# Patient Record
Sex: Male | Born: 1965 | Race: White | Hispanic: No | Marital: Married | Smoking: Current every day smoker
Health system: Southern US, Community
[De-identification: ages and names within clinical notes are randomized; demographics above are authoritative.]

## PROBLEM LIST (undated history)

## (undated) DIAGNOSIS — R0789 Other chest pain: Secondary | ICD-10-CM

## (undated) DIAGNOSIS — R9430 Abnormal result of cardiovascular function study, unspecified: Secondary | ICD-10-CM

## (undated) DIAGNOSIS — I10 Essential (primary) hypertension: Secondary | ICD-10-CM

## (undated) DIAGNOSIS — J189 Pneumonia, unspecified organism: Secondary | ICD-10-CM

## (undated) DIAGNOSIS — R002 Palpitations: Secondary | ICD-10-CM

## (undated) HISTORY — PX: COLON SURGERY: SHX602

## (undated) HISTORY — DX: Abnormal result of cardiovascular function study, unspecified: R94.30

## (undated) HISTORY — DX: Other chest pain: R07.89

## (undated) HISTORY — DX: Palpitations: R00.2

## (undated) HISTORY — PX: HERNIA REPAIR: SHX51

---

## 2015-01-07 ENCOUNTER — Observation Stay (HOSPITAL_COMMUNITY)
Admission: EM | Admit: 2015-01-07 | Discharge: 2015-01-08 | Disposition: A | Discharge status: Home / Self Care | Payer: Medicaid Other | Attending: Internal Medicine | Admitting: Internal Medicine

## 2015-01-07 ENCOUNTER — Emergency Department (HOSPITAL_COMMUNITY): Payer: Medicaid Other

## 2015-01-07 ENCOUNTER — Other Ambulatory Visit (HOSPITAL_COMMUNITY): Payer: Self-pay

## 2015-01-07 ENCOUNTER — Encounter (HOSPITAL_COMMUNITY): Payer: Self-pay | Admitting: *Deleted

## 2015-01-07 DIAGNOSIS — R61 Generalized hyperhidrosis: Secondary | ICD-10-CM | POA: Diagnosis not present

## 2015-01-07 DIAGNOSIS — Z720 Tobacco use: Secondary | ICD-10-CM | POA: Diagnosis not present

## 2015-01-07 DIAGNOSIS — F17210 Nicotine dependence, cigarettes, uncomplicated: Secondary | ICD-10-CM | POA: Insufficient documentation

## 2015-01-07 DIAGNOSIS — I10 Essential (primary) hypertension: Secondary | ICD-10-CM | POA: Diagnosis not present

## 2015-01-07 DIAGNOSIS — R079 Chest pain, unspecified: Secondary | ICD-10-CM | POA: Diagnosis present

## 2015-01-07 DIAGNOSIS — R0789 Other chest pain: Secondary | ICD-10-CM | POA: Diagnosis not present

## 2015-01-07 DIAGNOSIS — K219 Gastro-esophageal reflux disease without esophagitis: Secondary | ICD-10-CM | POA: Insufficient documentation

## 2015-01-07 DIAGNOSIS — R0602 Shortness of breath: Secondary | ICD-10-CM | POA: Insufficient documentation

## 2015-01-07 DIAGNOSIS — E785 Hyperlipidemia, unspecified: Secondary | ICD-10-CM | POA: Insufficient documentation

## 2015-01-07 DIAGNOSIS — R059 Cough, unspecified: Secondary | ICD-10-CM | POA: Diagnosis present

## 2015-01-07 DIAGNOSIS — R05 Cough: Secondary | ICD-10-CM | POA: Diagnosis present

## 2015-01-07 HISTORY — DX: Pneumonia, unspecified organism: J18.9

## 2015-01-07 LAB — CBC WITH DIFFERENTIAL/PLATELET
BASOS ABS: 0 10*3/uL (ref 0.0–0.1)
BASOS PCT: 0 % (ref 0–1)
EOS PCT: 2 % (ref 0–5)
Eosinophils Absolute: 0.2 10*3/uL (ref 0.0–0.7)
HCT: 48.7 % (ref 39.0–52.0)
Hemoglobin: 17.5 g/dL — ABNORMAL HIGH (ref 13.0–17.0)
Lymphocytes Relative: 31 % (ref 12–46)
Lymphs Abs: 3.2 10*3/uL (ref 0.7–4.0)
MCH: 30.9 pg (ref 26.0–34.0)
MCHC: 35.9 g/dL (ref 30.0–36.0)
MCV: 85.9 fL (ref 78.0–100.0)
MONO ABS: 0.8 10*3/uL (ref 0.1–1.0)
Monocytes Relative: 8 % (ref 3–12)
Neutro Abs: 6.1 10*3/uL (ref 1.7–7.7)
Neutrophils Relative %: 59 % (ref 43–77)
PLATELETS: 232 10*3/uL (ref 150–400)
RBC: 5.67 MIL/uL (ref 4.22–5.81)
RDW: 13.6 % (ref 11.5–15.5)
WBC: 10.4 10*3/uL (ref 4.0–10.5)

## 2015-01-07 LAB — BASIC METABOLIC PANEL
Anion gap: 9 (ref 5–15)
BUN: 13 mg/dL (ref 6–23)
CO2: 25 mmol/L (ref 19–32)
Calcium: 8.8 mg/dL (ref 8.4–10.5)
Chloride: 104 mmol/L (ref 96–112)
Creatinine, Ser: 0.79 mg/dL (ref 0.50–1.35)
GFR calc Af Amer: >90 mL/min (ref >90)
GFR calc non Af Amer: >90 mL/min (ref >90)
GLUCOSE: 113 mg/dL — AB (ref 70–99)
Potassium: 3.8 mmol/L (ref 3.5–5.1)
Sodium: 138 mmol/L (ref 135–145)

## 2015-01-07 LAB — I-STAT TROPONIN, ED: TROPONIN I, POC: 0.01 ng/mL (ref 0.00–0.08)

## 2015-01-07 LAB — TROPONIN I: Troponin I: <0.03 ng/mL (ref <0.031)

## 2015-01-07 MED ORDER — NITROGLYCERIN 0.4 MG SL SUBL
0.4000 mg | SUBLINGUAL_TABLET | SUBLINGUAL | Status: DC | PRN
  Administered 2015-01-07 (×2): 0.4 mg via SUBLINGUAL
  Filled 2015-01-07 (×2): qty 1

## 2015-01-07 MED ORDER — ENOXAPARIN SODIUM 40 MG/0.4ML ~~LOC~~ SOLN
40.0000 mg | SUBCUTANEOUS | Status: DC
Start: 2015-01-07 — End: 2015-01-08
  Administered 2015-01-07: 40 mg via SUBCUTANEOUS
  Filled 2015-01-07: qty 0.4

## 2015-01-07 MED ORDER — NITROGLYCERIN 2 % TD OINT
1.0000 [in_us] | TOPICAL_OINTMENT | Freq: Once | TRANSDERMAL | Status: AC
Start: 2015-01-07 — End: 2015-01-07
  Administered 2015-01-07: 1 [in_us] via TOPICAL
  Filled 2015-01-07: qty 30

## 2015-01-07 MED ORDER — GI COCKTAIL ~~LOC~~: 30.0000 mL | Freq: Four times a day (QID) | ORAL | Status: DC | PRN

## 2015-01-07 MED ORDER — GUAIFENESIN-DM 100-10 MG/5ML PO SYRP: 5.0000 mL | ORAL_SOLUTION | ORAL | Status: DC | PRN

## 2015-01-07 MED ORDER — NITROGLYCERIN 2 % TD OINT: 1.0000 [in_us] | TOPICAL_OINTMENT | Freq: Three times a day (TID) | TRANSDERMAL | Status: DC | PRN

## 2015-01-07 MED ORDER — ASPIRIN EC 325 MG PO TBEC
325.0000 mg | DELAYED_RELEASE_TABLET | Freq: Every day | ORAL | Status: DC
Start: 2015-01-08 — End: 2015-01-08
  Administered 2015-01-08: 325 mg via ORAL
  Filled 2015-01-07: qty 1

## 2015-01-07 MED ORDER — ACETAMINOPHEN 325 MG PO TABS: 650.0000 mg | ORAL_TABLET | ORAL | Status: DC | PRN

## 2015-01-07 MED ORDER — METOPROLOL TARTRATE 25 MG PO TABS
25.0000 mg | ORAL_TABLET | Freq: Two times a day (BID) | ORAL | Status: DC
  Administered 2015-01-07 – 2015-01-08 (×3): 25 mg via ORAL
  Filled 2015-01-07 (×3): qty 1

## 2015-01-07 MED ORDER — ASPIRIN 81 MG PO CHEW
162.0000 mg | CHEWABLE_TABLET | Freq: Once | ORAL | Status: AC
  Administered 2015-01-07: 162 mg via ORAL
  Filled 2015-01-07: qty 2

## 2015-01-07 MED ORDER — ONDANSETRON HCL 4 MG/2ML IJ SOLN: 4.0000 mg | Freq: Four times a day (QID) | INTRAMUSCULAR | Status: DC | PRN

## 2015-01-07 MED ORDER — ASPIRIN 325 MG PO TABS: 325.0000 mg | ORAL_TABLET | Freq: Once | ORAL | Status: DC

## 2015-01-07 NOTE — Consult Note (Addendum)
CARDIOLOGY CONSULT NOTE   Patient ID: Daryl Fuller MRN: [id number] DOB/AGE: February 03, 1966 49 y.o.  Admit Date: 01/07/2015  Primary Physician: No primary care provider on file.  Primary Cardiologist    Narda Rutherford   Clinical Summary Daryl Fuller is a 49 y.o.male. There is no prior documented cardiac history. Daryl Fuller presented since with greater than 7 days of vague chest discomfort. Daryl Fuller has had a cough which has been productive. Daryl Fuller also says that Daryl Fuller does have some chest discomfort with deep inspiration. Chest x-ray does not show any significant abnormalities. There is no fever. Daryl Fuller says that Daryl Fuller felt better in the emergency room after his blood was drawn at the same time that Daryl Fuller had nitroglycerin. EKG shows nonspecific ST-T wave changes. His first enzyme is normal. The patient does smoke.   No Known Allergies  Medications Scheduled Medications:     Infusions:     PRN Medications:  nitroGLYCERIN   Past Medical History  Diagnosis Date  . Pneumonia     Past Surgical History  Procedure Laterality Date  . Colon surgery    . Hernia repair      Family history: There is a family history of coronary disease. Patient's father had an MI at age 44.  Social History Daryl Fuller reports that Daryl Fuller has been smoking.  Daryl Fuller has never used smokeless tobacco. Daryl Fuller reports that Daryl Fuller does not drink alcohol.  Review of Systems Complete review of systems are found to be negative unless outlined in H&P above.  Physical Examination Blood pressure 131/91, pulse 95, temperature 98.7 F (37.1 C), temperature source Oral, resp. rate 16, SpO2 98 %. No intake or output data in the 24 hours ending 01/07/15 1240  The patient is with his family in the emergency room. Daryl Fuller is oriented to person time and place. Affect is normal. His English is good. Head is atraumatic. Sclera and conjunctiva are normal. There is no jugular venous distention. Lungs are clear. Respiratory effort is not labored. Cardiac exam  reveals S1 and S2. The abdomen is soft. There is no peripheral edema. There are no musculoskeletal deformities. There are no skin rashes. Neurologic is grossly intact.  Prior Cardiac Testing/Procedures  Lab Results  Basic Metabolic Panel:  Recent Labs Lab 01/07/15 0845  NA 138  K 3.8  CL 104  CO2 25  GLUCOSE 113*  BUN 13  CREATININE 0.79  CALCIUM 8.8    Liver Function Tests: No results for input(s): AST, ALT, ALKPHOS, BILITOT, PROT, ALBUMIN in the last 168 hours.  CBC:  Recent Labs Lab 01/07/15 0845  WBC 10.4  NEUTROABS 6.1  HGB 17.5*  HCT 48.7  MCV 85.9  PLT 232    Cardiac Enzymes: No results for input(s): CKTOTAL, CKMB, CKMBINDEX, TROPONINI in the last 168 hours.  BNP: Invalid input(s): POCBNP   Radiology: Dg Chest Portable 1 View  01/07/2015   CLINICAL DATA:  Chest pain for 10 days  EXAM: PORTABLE CHEST - 1 VIEW  COMPARISON:  None.  FINDINGS: The heart size and mediastinal contours are within normal limits. Both lungs are clear. The visualized skeletal structures are unremarkable.  IMPRESSION: No active disease.   Electronically Signed   By: Alcide Clever M.D.   On: 01/07/2015 08:58     ECG: There is no old EKG for comparison. Current EKG reveals nonspecific ST-T wave changes.   Impression and Recommendations    Chest pain at rest      At this point  it is not clear if this pain is cardiac or not. His troponin is normal. EKG is not diagnostic. I recommend checking his enzymes. I'll also recommend two-dimensional echo. Also Daryl Fuller will be helpful to further assess his cough. If enzymes are positive, cardiac catheterization would be indicated. If LV function is normal, and enzymes are negative, nuclear stress testing would be appropriate. The patient also mention the possibility of some palpitations. There has been no documented arrhythmia at this point. Daryl Fuller will be monitored in the hospital.    GERD (gastroesophageal reflux disease)    Tobacco abuse       Smoking cessation is recommended.    Cough    Etiology of his cough is not clear. There may be a pleuritic component to his chest discomfort.    Daryl BonitoJeff Arthor Gorter, MD 01/07/2015, 12:40 PM

## 2015-01-07 NOTE — Progress Notes (Signed)
Patient admitted from ED to 1433, alert and oriented, denies any chest pain. Oriented patient to room/unit and reviewed plan of care with patient. No wound noted, skin intact. Will continue to assess patient.

## 2015-01-07 NOTE — ED Notes (Signed)
MD at bedside. Cards 

## 2015-01-07 NOTE — H&P (Addendum)
[facility] History and Physical  Daryl Fuller ZOX:[id number] DOB: [birth year] DOA: 01/07/2015  Referring physician: Dr. Micheline Mazeocherty PCP: No primary care provider on file.   Chief Complaint:  Chest pain since 10-15 days  HPI:  49 year old male with history of GERD presented to the ED with acute left-sided chest pain that woke him up from sleep this morning. He reports squeezing chest pain , nonradiating, 9/10 in severity without any aggravating or relieving factors. He had associated palpitations and the pain radiating to the neck. Patient reports having similar chest pain off and on for the past 10-15 days which was less severe, frequently worsened with activity, feeling of gaseous distention and improving with burping. He reports underlying GERD but the symptoms are not remotely similar to this. This morning he also had palpitations and diaphoresis but denied any shortness of breath, orthopnea or dizziness. Denied any headache or blurred vision, abdominal pain, nausea, vomiting, near syncopal symptoms. Patient also reports having cough with small whitish phlegm for last several days. He continues to smoke one half pack of cigarettes per day for past 20-30 years. Denies bowel or urinary symptoms. Denies change in weight or appetite.  Worsened the ED Patient blood pressure was elevated to 158/107 mmHg. Remaining vitals were stable. Blood work was unremarkable. Initial troponin was negative. EKG showed J-point elevation in V2 and V3 with T-wave inversion in lead V5 and V6 (no oral EKG to compare). Chest x-ray was unremarkable. Patient was given 162 mg oral aspirin and placed on Nitropaste. Cardiology was consulted who recommended admission to hospitalist service.  Review of Systems:  Constitutional: Diaphoresis+, Denies fever, chills, diaphoresis, appetite change and fatigue.  HEENT: Denies visual or hearing symptoms, difficulty swallowing, runny nose, congestion, neck pain or stiffness   Respiratory: Chest tightness, Denies SOB, DOE, cough, and wheezing.   Cardiovascular:  chest pain, palpitations, denies leg swelling.  Gastrointestinal: Denies nausea, vomiting, abdominal pain, diarrhea, constipation, blood in stool and abdominal distention.  Genitourinary: Denies dysuria, hematuria, flank pain and difficulty urinating.  Endocrine: Denies: hot or cold intolerance, polyuria, polydipsia. Musculoskeletal: Denies myalgias, back pain, joint pain or stiffness Skin: Denies rash and wound.  Neurological: Denies dizziness, syncope, weakness, light-headedness, numbness and headaches.  Hematological: Denies adenopathy. Psychiatric/Behavioral: Denies confusion  Past Medical History  Diagnosis Date  . Pneumonia    Past Surgical History  Procedure Laterality Date  . Colon surgery    . Hernia repair     Social History: Smokes one half pack per day, denies alcohol or illicit drug use, lives with his family  No Known Allergies  Family history Father had hypertension and chronic kidney disease on dialysis. Father died of MI and is of [id number]  Prior to Admission medications   Medication Sig Start Date End Date Taking? Authorizing Provider  omeprazole (PRILOSEC OTC) 20 MG tablet Take 20 mg by mouth daily.   Yes Historical Provider, MD     Physical Exam:  Filed Vitals:   01/07/15 0833 01/07/15 1045 01/07/15 1100  BP: 158/107 131/84 131/91  Pulse: 84 83 95  Temp: 98.7 F (37.1 C)    TempSrc: Oral    Resp: 30 16 16   SpO2: 97% 95% 98%    Constitutional: Vital signs reviewed. Middle aged male in no acute distress HEENT: no pallor, no icterus, moist oral mucosa, no cervical lymphadenopathy, supple neck Cardiovascular: RRR, S1 normal, S2 normal, no MRG Chest: CTAB, no wheezes, rales, or rhonchi GI: Soft. Non-tender, non-distended, bowel sounds are normal,   musculoskeletal:  warm, no edema Neurological: Alert and oriented  Labs on Admission:  Basic Metabolic Panel:  Recent  Labs Lab 01/07/15 0845  NA 138  K 3.8  CL 104  CO2 25  GLUCOSE 113*  BUN 13  CREATININE 0.79  CALCIUM 8.8   Liver Function Tests: No results for input(s): AST, ALT, ALKPHOS, BILITOT, PROT, ALBUMIN in the last 168 hours. No results for input(s): LIPASE, AMYLASE in the last 168 hours. No results for input(s): AMMONIA in the last 168 hours. CBC:  Recent Labs Lab 01/07/15 0845  WBC 10.4  NEUTROABS 6.1  HGB 17.5*  HCT 48.7  MCV 85.9  PLT 232   Cardiac Enzymes: No results for input(s): CKTOTAL, CKMB, CKMBINDEX, TROPONINI in the last 168 hours. BNP: Invalid input(s): POCBNP CBG: No results for input(s): GLUCAP in the last 168 hours.  Radiological Exams on Admission: Dg Chest Portable 1 View  01/07/2015   CLINICAL DATA:  Chest pain for 10 days  EXAM: PORTABLE CHEST - 1 VIEW  COMPARISON:  None.  FINDINGS: The heart size and mediastinal contours are within normal limits. Both lungs are clear. The visualized skeletal structures are unremarkable.  IMPRESSION: No active disease.   Electronically Signed   By: Alcide Clever M.D.   On: 01/07/2015 08:58    EKG: NSR, J point elevation in V2-V3, T-wave inversion in V5 and V6  Assessment/Plan Principal Problem:   Chest pain at rest Has both typical and atypical components.  pain has been ongoing for past 10-15 days , has underlying GERD symptoms and a possible musculoskeletal component. Also c/o cough with some whitish phlegm off and on.  admit to telemetry on observation. -Heart score of 4.( rik factors include positive family hx, age and tobacco use) -order ASA 325 mg daily. S/l nitroglycerine prn. Cycle serial troponin and EKG. Seen by cardiology and recommend to cycle cardiac markers and monitor . Check 2 D echo and am lipid panel .    Active Problems:   GERD (gastroesophageal reflux disease) Switch to protonix. Add maalox.     Tobacco abuse counseled on cessation. Has no plan on quitting.     Diet:cardiac  DVT  prophylaxis: sq lovenox   Code Status: full code Family Communication:None at bedside Disposition Plan: Admit under obs  Jaramie Bastos [facility] Pager [phone]  Total time spent on admission  45 minutes  If 7PM-7AM, please contact night-coverage [email or web address] Password [facility] 01/07/2015, 12:44 PM

## 2015-01-07 NOTE — ED Provider Notes (Signed)
CSN: [id number]     Arrival date & time 01/07/15  [id number] History   First MD Initiated Contact with Patient 01/07/15 [phone]     Chief Complaint  Patient presents with  . Chest Pain  . Shortness of Breath     (Consider location/radiation/quality/duration/timing/severity/associated sxs/prior Treatment) Patient is a 49 y.o. male presenting with chest pain and shortness of breath. The history is provided by the patient and the spouse.  Chest Pain Pain location:  Substernal area and L chest Pain quality: dull and pressure   Pain radiates to:  L arm Pain radiates to the back: no   Pain severity:  Moderate Onset quality:  Unable to specify Duration:  10 days Timing:  Constant Progression:  Waxing and waning Chronicity:  New Context: at rest   Relieved by:  Nothing Worsened by:  Nothing tried Ineffective treatments:  Aspirin Associated symptoms: diaphoresis, nausea and shortness of breath   Associated symptoms: not vomiting   Risk factors: high cholesterol, male sex and smoking   Risk factors: no aortic disease, no coronary artery disease, no diabetes mellitus, no hypertension, no prior DVT/PE and no surgery   Shortness of Breath Associated symptoms: chest pain and diaphoresis   Associated symptoms: no vomiting     Past Medical History  Diagnosis Date  . Pneumonia    Past Surgical History  Procedure Laterality Date  . Colon surgery    . Hernia repair     History reviewed. No pertinent family history. History  Substance Use Topics  . Smoking status: Current Every Day Smoker  . Smokeless tobacco: Never Used  . Alcohol Use: No    Review of Systems  Constitutional: Positive for diaphoresis.  Respiratory: Positive for shortness of breath.   Cardiovascular: Positive for chest pain.  Gastrointestinal: Positive for nausea. Negative for vomiting.      Allergies  Review of patient's allergies indicates no known allergies.  Home Medications   Prior to Admission medications    Medication Sig Start Date End Date Taking? Authorizing Provider  omeprazole (PRILOSEC OTC) 20 MG tablet Take 20 mg by mouth daily.   Yes Historical Provider, MD   BP 154/85 mmHg  Pulse 74  Temp(Src) 98.2 F (36.8 C) (Oral)  Resp 18  Ht 5\' 9"  (1.753 m)  Wt 184 lb 9.6 oz (83.734 kg)  BMI 27.25 kg/m2  SpO2 99% Physical Exam  Constitutional: He is oriented to person, place, and time. He appears well-developed and well-nourished. No distress.  HENT:  Head: Normocephalic and atraumatic.  Mouth/Throat: No oropharyngeal exudate.  Eyes: Pupils are equal, round, and reactive to light.  Neck: Normal range of motion. Neck supple.  Cardiovascular: Normal rate, regular rhythm and normal heart sounds.  Exam reveals no gallop and no friction rub.   No murmur heard. Pulmonary/Chest: Effort normal and breath sounds normal. No respiratory distress. He has no wheezes. He has no rales.  Abdominal: Soft. Bowel sounds are normal. He exhibits no distension and no mass. There is no tenderness. There is no rebound and no guarding.  Musculoskeletal: Normal range of motion. He exhibits no edema or tenderness.  Neurological: He is alert and oriented to person, place, and time.  Skin: Skin is warm and dry.  Psychiatric: He has a normal mood and affect.    ED Course  Procedures (including critical care time) Labs Review Labs Reviewed  CBC WITH DIFFERENTIAL/PLATELET - Abnormal; Notable for the following:    Hemoglobin 17.5 (*)    All other components  within normal limits  BASIC METABOLIC PANEL - Abnormal; Notable for the following:    Glucose, Bld 113 (*)    All other components within normal limits  LIPID PANEL - Abnormal; Notable for the following:    Cholesterol 221 (*)    Triglycerides 371 (*)    HDL 28 (*)    VLDL 74 (*)    LDL Cholesterol 119 (*)    All other components within normal limits  TROPONIN I  TROPONIN I  TROPONIN I  I-STAT TROPOININ, ED    Imaging Review Dg Chest Portable 1  View  01/07/2015   CLINICAL DATA:  Chest pain for 10 days  EXAM: PORTABLE CHEST - 1 VIEW  COMPARISON:  None.  FINDINGS: The heart size and mediastinal contours are within normal limits. Both lungs are clear. The visualized skeletal structures are unremarkable.  IMPRESSION: No active disease.   Electronically Signed   By: Alcide Clever M.D.   On: 01/07/2015 08:58     EKG Interpretation   Date/Time:  Monday January 07 2015 08:31:10 EDT Ventricular Rate:  83 PR Interval:  168 QRS Duration: 98 QT Interval:  374 QTC Calculation: 439 R Axis:   5 Text Interpretation:  Sinus rhythm Probable left atrial enlargement  Nonspecific T abnormalities, inferior leads Less than 2mm STE V2, V3,   less than 1mm STE I, aVL TWI III, aVF Confirmed by DOCHERTY  MD, MEGAN  ([id number]) on 01/07/2015 8:56:07 AM      MDM   Final diagnoses:  Chest pain at rest   Pt is a 49 y.o. male with Pmhx as above who presents with 10 days of waxing and waning central/L sided chest pressure with assoc nausea, SOB, diaphoresis, productive cough. NO fever, chills, leg pain/swelling. On PE, VSS, pt in NAD. Cardiopulm exam benign. EKG with minor STE in V2, V3, but not meeting 2mm STE for STEMI criteria, also has les than 1mm STE in I, aVL, TWI in III, aVF. TIMI score 2 for ST changes and ASA use.    Dr. Myrtis Ser with cardiology has seen the patient is recommending medical admission.  Patient's pain resolved after 3 sublingual nitroglycerin.  [facility] will admit for further workup.  History is somewhat concerning for ACS, however.  First troponin is negative, and ACS less likely given 10 days of constant pain.      Toy Cookey, MD 01/08/15 [phone]

## 2015-01-07 NOTE — ED Notes (Signed)
Per Telephone communication with Dr. Myrtis SerKatz pt to be admitted to medical services. Pt will be on Heparin gtt and is NOT to take Eloquis. Pt has med at bedside and has been instructed not to take. Will let EDP know.

## 2015-01-07 NOTE — ED Notes (Signed)
Patient has been having central chest pressure for about 10 days. He thought that it was gas or a chest cold. He states that it has been getting worse. He was awakened with chest pressure, discomfort and shortness of breath without activity. Patient becomes nauseated and diaphoretic with pain. Patient denies cardiac history, but confirms family history.

## 2015-01-07 NOTE — ED Notes (Signed)
Per admitting MD admin SL nitro for chest pain. Pt is in no discomfort. Vitals stable.

## 2015-01-07 NOTE — ED Notes (Addendum)
MD at bedside. Admitting  

## 2015-01-08 DIAGNOSIS — Z720 Tobacco use: Secondary | ICD-10-CM

## 2015-01-08 DIAGNOSIS — R079 Chest pain, unspecified: Secondary | ICD-10-CM

## 2015-01-08 DIAGNOSIS — K219 Gastro-esophageal reflux disease without esophagitis: Secondary | ICD-10-CM

## 2015-01-08 LAB — LIPID PANEL
Cholesterol: 221 mg/dL — ABNORMAL HIGH (ref 0–200)
HDL: 28 mg/dL — ABNORMAL LOW (ref >39)
LDL Cholesterol: 119 mg/dL — ABNORMAL HIGH (ref 0–99)
TRIGLYCERIDES: 371 mg/dL — AB (ref <150)
Total CHOL/HDL Ratio: 7.9 RATIO
VLDL: 74 mg/dL — ABNORMAL HIGH (ref 0–40)

## 2015-01-08 LAB — TROPONIN I

## 2015-01-08 MED ORDER — METOPROLOL TARTRATE 25 MG PO TABS: 25.0000 mg | ORAL_TABLET | Freq: Two times a day (BID) | ORAL | Status: AC

## 2015-01-08 MED ORDER — OMEPRAZOLE MAGNESIUM 20 MG PO TBEC: 20.0000 mg | DELAYED_RELEASE_TABLET | Freq: Two times a day (BID) | ORAL | Status: AC

## 2015-01-08 MED ORDER — ASPIRIN EC 81 MG PO TBEC: 81.0000 mg | DELAYED_RELEASE_TABLET | Freq: Every day | ORAL | Status: AC

## 2015-01-08 MED ORDER — PANTOPRAZOLE SODIUM 40 MG PO TBEC
40.0000 mg | DELAYED_RELEASE_TABLET | Freq: Every day | ORAL | Status: DC
  Administered 2015-01-08: 40 mg via ORAL
  Filled 2015-01-08: qty 1

## 2015-01-08 NOTE — Progress Notes (Signed)
Patient ID: Kyra Mangesddy Reddy, male   DOB: 1966-07-12, 49 y.o.   MRN: [id number]    Subjective:  Denies SSCP, palpitations or Dyspnea   Objective:  Filed Vitals:   01/07/15 1821 01/07/15 2118 01/08/15 0145 01/08/15 0530  BP: 133/76 146/79 165/90 154/85  Pulse: 74 70 66 74  Temp: 97.7 F (36.5 C) 97.9 F (36.6 C) 98 F (36.7 C) 98.2 F (36.8 C)  TempSrc: Oral Oral Oral Oral  Resp: 20 20 18 18   Height:      Weight:      SpO2: 98% 100% 99% 99%    Intake/Output from previous day:  Intake/Output Summary (Last 24 hours) at 01/08/15 [id number] Last data filed at 01/07/15 1821  Gross per 24 hour  Intake    120 ml  Output      0 ml  Net    120 ml    Physical Exam: Affect appropriate Healthy:  appears stated age HEENT: normal Neck supple with no adenopathy JVP normal no bruits no thyromegaly Lungs clear with no wheezing and good diaphragmatic motion Heart:  S1/S2 no murmur, no rub, gallop or click PMI normal Abdomen: benighn, BS positve, no tenderness, no AAA no bruit.  No HSM or HJR Distal pulses intact with no bruits No edema Neuro non-focal Skin warm and dry No muscular weakness   Current facility-administered medications:  .  acetaminophen (TYLENOL) tablet 650 mg, 650 mg, Oral, Q4H PRN, Nishant Dhungel, MD .  aspirin EC tablet 325 mg, 325 mg, Oral, Daily, Nishant Dhungel, MD .  enoxaparin (LOVENOX) injection 40 mg, 40 mg, Subcutaneous, Q24H, Nishant Dhungel, MD, 40 mg at 01/07/15 1348 .  gi cocktail (Maalox,Lidocaine,Donnatal), 30 mL, Oral, QID PRN, Nishant Dhungel, MD .  guaiFENesin-dextromethorphan (ROBITUSSIN DM) 100-10 MG/5ML syrup 5 mL, 5 mL, Oral, Q4H PRN, Nishant Dhungel, MD .  metoprolol tartrate (LOPRESSOR) tablet 25 mg, 25 mg, Oral, BID, Nishant Dhungel, MD, 25 mg at 01/07/15 2113 .  nitroGLYCERIN (NITROSTAT) SL tablet 0.4 mg, 0.4 mg, Sublingual, Q5 min PRN, Toy CookeyMegan Docherty, MD, 0.4 mg at 01/07/15 1233 .  ondansetron (ZOFRAN) injection 4 mg, 4 mg, Intravenous, Q6H  PRN, Eddie NorthNishant Dhungel, MD Lab Results:  Basic Metabolic Panel:  Recent Labs  62/13/0803/21/16 0845  NA 138  K 3.8  CL 104  CO2 25  GLUCOSE 113*  BUN 13  CREATININE 0.79  CALCIUM 8.8   CBC:  Recent Labs  01/07/15 0845  WBC 10.4  NEUTROABS 6.1  HGB 17.5*  HCT 48.7  MCV 85.9  PLT 232   Cardiac Enzymes:  Recent Labs  01/07/15 1358 01/07/15 2015 01/08/15 0147  TROPONINI <0.03 <0.03 <0.03   Fasting Lipid Panel:  Recent Labs  01/08/15 0146  CHOL 221*  HDL 28*  LDLCALC 119*  TRIG 371*  CHOLHDL 7.9   Thyroid Function Tests: No results for input(s): TSH, T4TOTAL, T3FREE, THYROIDAB in the last 72 hours.  Invalid input(s): FREET3 Anemia Panel: No results for input(s): VITAMINB12, FOLATE, FERRITIN, TIBC, IRON, RETICCTPCT in the last 72 hours.  Imaging: Dg Chest Portable 1 View  01/07/2015   CLINICAL DATA:  Chest pain for 10 days  EXAM: PORTABLE CHEST - 1 VIEW  COMPARISON:  None.  FINDINGS: The heart size and mediastinal contours are within normal limits. Both lungs are clear. The visualized skeletal structures are unremarkable.  IMPRESSION: No active disease.   Electronically Signed   By: Alcide CleverMark  Lukens M.D.   On: 01/07/2015 08:58    Cardiac Studies:  ECG:  SR nonspecific ST/T wave changes    Telemetry:  NSR no arrhythmia  Echo:  Pending   Medications:   . aspirin EC  325 mg Oral Daily  . enoxaparin (LOVENOX) injection  40 mg Subcutaneous Q24H  . metoprolol tartrate  25 mg Oral BID       Assessment/Plan:  Chest Pain:  Atypical  R/o despite long episodes of pain ECG nonspecific ST changes  If echo shows no RWMA;s  D/c home with beta blocker and ASA Outpatient myovue and f/u with Dr Myrtis Ser.  If EF bad or discrete RWMA will arrange cath Consider protonix given GI overtones  Does also have history of Pancreatitis but denies ETOH  HTN:  Continue beta blocker   Charlton Haws 01/08/2015, 8:08 AM

## 2015-01-08 NOTE — Progress Notes (Signed)
Patient ID: Daryl Fuller, male   DOB: 03-27-1966, 49 y.o.   MRN: [id number] Echo is normal  Ok to d/c  Will arrange outpatient stress myovue and f/u with Dr Salley HewsKatz  [name]

## 2015-01-08 NOTE — Discharge Summary (Signed)
Physician Discharge Summary  Daryl Fuller ZOX:[id number] DOB: 04-15-1966 DOA: 01/07/2015  PCP: No primary care provider on file.  Admit date: 01/07/2015 Discharge date: 01/08/2015  Recommendations for Outpatient Follow-up:  1. Pt will need to follow up with PCP in 2 weeks post discharge 2. Please obtain BMP in 2 weeks  Discharge Diagnoses:   atypical chest pain  -There is a component of GERD -Troponins negative 3 -Echocardiogram--EF 60-65%, no WMA -Patient cardiology follow-up--If echo shows no RWMA;s D/c home with beta blocker and ASA and then outpatient myoview and f/u with Dr Myrtis SerKatz -Continue beta blocker -Continue aspirin -EKG with sinus rhythm, nonspecific ST-T wave changes GERD -The patient wants to take over-the-counter strength omeprazole -The patient to take omeprazole 20 mg twice a day for the next month to see if it makes a difference -Anti-reflex measures were discussed with the patient -He frequently goes to sleep immediately after eating Dyslipidemia -For now,lifestyle modification -establish care with PCP HTN -metoprolol tartrate at d/c  Discharge Condition: stable  Disposition:      Follow-up Information    Follow up with Willa RoughJeffrey Katz, MD In 1 week.   Specialty:  Cardiology   Contact information:   [address] [place] [address] [phone]     home  Diet:heart healthy Wt Readings from Last 3 Encounters:  01/07/15 83.734 kg (184 lb 9.6 oz)    History of present illness:   49 year old male with a history of GERD and tobacco abuse presents with chest pain that woke him up from sleep on the day of admission. However, the patient had been complaining of chest discomfort intermittently for the past 2 weeks. The patient states that on many occasions belching would make his chest discomfort better. On most occasions, his chest discomfort occurs at rest but occasionally has pain with exertion. He denied any shortness of breath,  nausea, vomiting, dizziness. The patient was admitted and cardiac enzymes were cycled. Cardiology was consulted. They recommended cycling the patient's troponins which were negative. In addition, they also recommended discharge home with aspirin and beta blocker if the patient's echocardiogram showed normal EF function with no wall motion abnormalities which it did. The patient will be discharged home to follow up with cardiology in 2 weeks.  Consultants: cardiolgy  Discharge Exam: Filed Vitals:   01/08/15 0530  BP: 154/85  Pulse: 74  Temp: 98.2 F (36.8 C)  Resp: 18   Filed Vitals:   01/07/15 1821 01/07/15 2118 01/08/15 0145 01/08/15 0530  BP: 133/76 146/79 165/90 154/85  Pulse: 74 70 66 74  Temp: 97.7 F (36.5 C) 97.9 F (36.6 C) 98 F (36.7 C) 98.2 F (36.8 C)  TempSrc: Oral Oral Oral Oral  Resp: 20 20 18 18   Height:      Weight:      SpO2: 98% 100% 99% 99%   General: A&O x 3, NAD, pleasant, cooperative Cardiovascular: RRR, no rub, no gallop, no S3 Respiratory: CTAB, no wheeze, no rhonchi Abdomen:soft, nontender, nondistended, positive bowel sounds Extremities: No edema, No lymphangitis, no petechiae  Discharge Instructions     Medication List    TAKE these medications        aspirin EC 81 MG tablet  Take 1 tablet (81 mg total) by mouth daily.     metoprolol tartrate 25 MG tablet  Commonly known as:  LOPRESSOR  Take 1 tablet (25 mg total) by mouth 2 (two) times daily.     omeprazole 20 MG tablet  Commonly  known as:  PRILOSEC OTC  Take 1 tablet (20 mg total) by mouth 2 (two) times daily. For one month, then once daily thereafter         The results of significant diagnostics from this hospitalization (including imaging, microbiology, ancillary and laboratory) are listed below for reference.    Significant Diagnostic Studies: Dg Chest Portable 1 View  01/07/2015   CLINICAL DATA:  Chest pain for 10 days  EXAM: PORTABLE CHEST - 1 VIEW  COMPARISON:  None.   FINDINGS: The heart size and mediastinal contours are within normal limits. Both lungs are clear. The visualized skeletal structures are unremarkable.  IMPRESSION: No active disease.   Electronically Signed   By: Alcide Clever M.D.   On: 01/07/2015 08:58     Microbiology: No results found for this or any previous visit (from the past 240 hour(s)).   Labs: Basic Metabolic Panel:  Recent Labs Lab 01/07/15 0845  NA 138  K 3.8  CL 104  CO2 25  GLUCOSE 113*  BUN 13  CREATININE 0.79  CALCIUM 8.8   Liver Function Tests: No results for input(s): AST, ALT, ALKPHOS, BILITOT, PROT, ALBUMIN in the last 168 hours. No results for input(s): LIPASE, AMYLASE in the last 168 hours. No results for input(s): AMMONIA in the last 168 hours. CBC:  Recent Labs Lab 01/07/15 0845  WBC 10.4  NEUTROABS 6.1  HGB 17.5*  HCT 48.7  MCV 85.9  PLT 232   Cardiac Enzymes:  Recent Labs Lab 01/07/15 1358 01/07/15 2015 01/08/15 0147  TROPONINI <0.03 <0.03 <0.03   BNP: Invalid input(s): POCBNP CBG: No results for input(s): GLUCAP in the last 168 hours.  Time coordinating discharge:  Greater than 30 minutes  Signed:  Oanh Devivo, DO [facility] Pager: [phone] 01/08/2015, 11:14 AM

## 2015-01-08 NOTE — Progress Notes (Signed)
  Echocardiogram 2D Echocardiogram has been performed.  Janalyn HarderWest, Taheem Fricke R 01/08/2015, 9:52 AM

## 2015-01-08 NOTE — Progress Notes (Signed)
UR completed 

## 2015-01-31 ENCOUNTER — Telehealth: Payer: Self-pay | Admitting: *Deleted

## 2015-01-31 DIAGNOSIS — R0789 Other chest pain: Secondary | ICD-10-CM

## 2015-01-31 NOTE — Telephone Encounter (Signed)
-----   Message from [name] sent at 01/29/2015 12:35 PM EDT ----- Can you put in an order for me please    g ----- Message -----    From: Wendall StadePeter C Nishan, MD    Sent: 01/08/2015  10:43 AM      To: [name], [name]  Needs exercise stress myovue this week if possible and f/u with Dr Myrtis SerKatz post hospital d/c for chest pain

## 2015-01-31 NOTE — Telephone Encounter (Signed)
PT  AWARE  AS WELL AS  SPOKE  TO MALE  THAT  PT  PUT ON PHONE .Zack Seal/CY

## 2015-02-11 ENCOUNTER — Ambulatory Visit (HOSPITAL_COMMUNITY): Payer: Medicaid Other

## 2015-02-13 ENCOUNTER — Ambulatory Visit (HOSPITAL_COMMUNITY): Payer: Medicaid Other | Attending: Cardiology | Admitting: Radiology

## 2015-02-13 VITALS — BP 126/90 | Ht 69.0 in | Wt 190.0 lb

## 2015-02-13 DIAGNOSIS — R9431 Abnormal electrocardiogram [ECG] [EKG]: Secondary | ICD-10-CM | POA: Diagnosis present

## 2015-02-13 DIAGNOSIS — R079 Chest pain, unspecified: Secondary | ICD-10-CM | POA: Diagnosis not present

## 2015-02-13 DIAGNOSIS — R0789 Other chest pain: Secondary | ICD-10-CM | POA: Diagnosis not present

## 2015-02-13 DIAGNOSIS — I10 Essential (primary) hypertension: Secondary | ICD-10-CM | POA: Insufficient documentation

## 2015-02-13 MED ORDER — TECHNETIUM TC 99M SESTAMIBI GENERIC - CARDIOLITE
11.0000 | Freq: Once | INTRAVENOUS | Status: AC | PRN
  Administered 2015-02-13: 11 via INTRAVENOUS

## 2015-02-13 MED ORDER — TECHNETIUM TC 99M SESTAMIBI GENERIC - CARDIOLITE
33.0000 | Freq: Once | INTRAVENOUS | Status: AC | PRN
  Administered 2015-02-13: 33 via INTRAVENOUS

## 2015-02-13 NOTE — Progress Notes (Signed)
[facility] SITE 3 NUCLEAR MED [address] [place], [place] [address] [phone]    Cardiology Nuclear Med Study  Daryl Fuller is a 49 y.o. male     MRN : [id number]     DOB: 1966-05-01  Procedure Date: 02/13/2015  Nuclear Med Background Indication for Stress Test:  Evaluation for Ischemia, Abnormal EKG, and Patient seen in hospital on 01-07-2015 for Chest Pain, Troponin negative History:  n/a Cardiac Risk Factors: Hypertension  Symptoms:  Chest Pain   Nuclear Pre-Procedure Caffeine/Decaff Intake:  None> 12 hrs NPO After: 12:00am   Lungs:  clear O2 Sat: 98% on room air. IV 0.9% NS with Angio Cath:  22g  IV Site: R Antecubital x 1, tolerated well IV Started by:  Irean HongPatsy Jorden Minchey, RN  Chest Size (in):  42 Cup Size: n/a  Height: 5\' 9"  (1.753 m)  Weight:  190 lb (86.183 kg)  BMI:  Body mass index is 28.05 kg/(m^2). Tech Comments:  Patient held Lopressor x 24 hrs. Irean HongPatsy Kamerin Axford, RN.    Nuclear Med Study 1 or 2 day study: 1 day  Stress Test Type:  Stress  Reading MD: N/A  Order Authorizing Provider:  Willa RoughJeffrey Katz, MD  Resting Radionuclide: Technetium 735m Sestamibi  Resting Radionuclide Dose: 11.0 mCi   Stress Radionuclide:  Technetium 3735m Sestamibi  Stress Radionuclide Dose: 33.0 mCi           Stress Protocol Rest HR: 74 Stress HR: 166  Rest BP: 126/90 Stress BP: 226/115  Exercise Time (min): 8:00 METS: 10.10   Predicted Max HR: 173 bpm % Max HR: 95.95 bpm Rate Pressure Product: [id number]   Dose of Adenosine (mg):  n/a Dose of Lexiscan: n/a mg  Dose of Atropine (mg): n/a Dose of Dobutamine: n/a mcg/kg/min (at max HR)  Stress Test Technologist: Milana NaSabrina Williams, EMT-P  Nuclear Technologist:  Kerby NoraElzbieta Kubak, CNMT     Rest Procedure:  Myocardial perfusion imaging was performed at rest 45 minutes following the intravenous administration of Technetium 535m Sestamibi. Rest ECG: Normal sinus rhythm. Nonspecific ST-T wave abnormalities.  Stress Procedure:  The patient  exercised on the treadmill utilizing the Bruce Protocol for 8:00 minutes. The patient stopped due to fatigue, sob, and denied any chest pain.  Technetium 5035m Sestamibi was injected at peak exercise and myocardial perfusion imaging was performed after a brief delay. Stress ECG: No significant change from baseline ECG  QPS Raw Data Images:  Normal; no motion artifact; normal heart/lung ratio. Stress Images:  Normal homogeneous uptake in all areas of the myocardium. Rest Images:  Normal homogeneous uptake in all areas of the myocardium. Subtraction (SDS):  No evidence of ischemia. Transient Ischemic Dilatation (Normal <1.22):  0.88 Lung/Heart Ratio (Normal <0.45):  0.38  Quantitative Gated Spect Images QGS EDV:  109 ml QGS ESV:  50 ml  Impression Exercise Capacity:  Fair exercise capacity. BP Response:  Hypertensive blood pressure response. Clinical Symptoms:  Shortness of breath ECG Impression:  No significant ST segment change suggestive of ischemia. Comparison with Prior Nuclear Study: No images to compare  Overall Impression:  There is decreased exercise tolerance. There is a significant hypertensive response to stress. However there are no EKG changes. The nuclear images are normal. There is no scar or ischemia. This is a low risk scan.  LV Ejection Fraction: 55%.  LV Wall Motion:  Normal Wall Motion   Willa RoughJeffrey Katz, MD

## 2015-02-22 ENCOUNTER — Ambulatory Visit (INDEPENDENT_AMBULATORY_CARE_PROVIDER_SITE_OTHER): Payer: Medicaid Other | Admitting: Cardiology

## 2015-02-22 ENCOUNTER — Encounter: Payer: Self-pay | Admitting: Cardiology

## 2015-02-22 VITALS — BP 138/90 | HR 79 | Ht 69.0 in | Wt 192.0 lb

## 2015-02-22 DIAGNOSIS — R079 Chest pain, unspecified: Secondary | ICD-10-CM

## 2015-02-22 DIAGNOSIS — R229 Localized swelling, mass and lump, unspecified: Secondary | ICD-10-CM | POA: Diagnosis not present

## 2015-02-22 DIAGNOSIS — R002 Palpitations: Secondary | ICD-10-CM | POA: Insufficient documentation

## 2015-02-22 DIAGNOSIS — R9430 Abnormal result of cardiovascular function study, unspecified: Secondary | ICD-10-CM | POA: Insufficient documentation

## 2015-02-22 DIAGNOSIS — R0989 Other specified symptoms and signs involving the circulatory and respiratory systems: Secondary | ICD-10-CM

## 2015-02-22 DIAGNOSIS — I10 Essential (primary) hypertension: Secondary | ICD-10-CM | POA: Insufficient documentation

## 2015-02-22 MED ORDER — CHLORTHALIDONE 25 MG PO TABS: 25.0000 mg | ORAL_TABLET | Freq: Every day | ORAL | Status: AC

## 2015-02-22 NOTE — Patient Instructions (Addendum)
Medication Instructions:  Start taking Chlorthalidone 25 mg daily. All other medications remain the same.  Labwork: None  Testing/Procedures: Your physician has recommended that you wear an event monitor. Event monitors are medical devices that record the heart's electrical activity. Doctors most often us these monitors to diagnose arrhythmias. Arrhythmias are problems with the speed or rhythm of the heartbeat. The monitor is a small, portable device. You can wear one while you do your normal daily activities. This is usually used to diagnose what is causing palpitations/syncope (passing out).   Follow-Up: Your physician recommends that you schedule a follow-up appointment in: 6 weeks

## 2015-02-22 NOTE — Assessment & Plan Note (Signed)
Patient had a significant hypertensive response to his stress test. He is to continue on his beta blocker. He also mentions he might have some slight "swelling." I will start him on chlorthalidone 25 mg daily.

## 2015-02-22 NOTE — Progress Notes (Addendum)
Cardiology Office Note   Date:  02/22/2015   ID:  Daryl PlowmanDDY Frisk, DOB May 27, 1966, MRN [id number]  PCP:  Eartha InchBADGER,Daryl C, MD  Cardiologist:  Daryl RoughJeffrey Mckenna Boruff, MD   Chief Complaint  Patient presents with  . Appointment    Follow-up hospitalization for chest tightness      History of Present Illness: Daryl Fuller is a 49 y.o. male who presents follow-up hospitalization for some chest tightness. His echo there was normal. He had a follow-up outpatient stress nuclear scan. This study was normal. He is here today for follow-up. He mentions that he is having palpitations. He is not having syncope or presyncope. He is also concerned about some pustules on the back of his neck and on his scalp. He will follow-up with his primary physician for this.    Past Medical History  Diagnosis Date  . Pneumonia   . Chest tightness   . Palpitation   . Ejection fraction     Past Surgical History  Procedure Laterality Date  . Colon surgery    . Hernia repair      Patient Active Problem List   Diagnosis Date Noted  . Palpitations 02/22/2015  . Ejection fraction   . Chest pain at rest 01/07/2015  . GERD (gastroesophageal reflux disease) 01/07/2015  . Tobacco abuse 01/07/2015  . Cough 01/07/2015      Current Outpatient Prescriptions  Medication Sig Dispense Refill  . aspirin EC 81 MG tablet Take 1 tablet (81 mg total) by mouth daily. 30 tablet 0  . metoprolol tartrate (LOPRESSOR) 25 MG tablet Take 1 tablet (25 mg total) by mouth 2 (two) times daily. 60 tablet 1  . omeprazole (PRILOSEC OTC) 20 MG tablet Take 1 tablet (20 mg total) by mouth 2 (two) times daily. For one month, then once daily thereafter 60 tablet 0  . chlorthalidone (HYGROTON) 25 MG tablet Take 1 tablet (25 mg total) by mouth daily. 30 tablet 3   No current facility-administered medications for this visit.    Allergies:   Review of patient's allergies indicates no known allergies.    Social History:  The patient  reports  that he has been smoking.  He has never used smokeless tobacco. He reports that he does not drink alcohol or use illicit drugs.   Family History:  The patient's family history includes Breast cancer in his sister; Cancer in his mother; Cancer - Cervical in his sister; Diabetes in his father; Hyperlipidemia in his brother; Hypertension in his brother; Kidney failure in his father.    ROS:  Please see the history of present illness.    Patient denies fever, chills, headache, sweats, change in vision, change in hearing, chest pain, cough, nausea or vomiting, urinary symptoms. He does have significant difficulty with sleep. He works at nighttime. All other systems are reviewed and are negative.    PHYSICAL EXAM: VS:  BP 138/90 mmHg  Pulse 79  Ht 5\' 9"  (1.753 m)  Wt 192 lb (87.091 kg)  BMI 28.34 kg/m2 , Patient is stable today. He is here with his wife. He is oriented to person time and place. Affect is normal. He does have some areas of skin lesions on his scalp. Some may be subcutaneous. They are tender. There is no redness or heat. There is no jugular venous distention. Lungs are clear. Respiratory effort is nonlabored. Cardiac exam reveals S1 and S2. Abdomen is soft. There is no peripheral edema. There are no musculoskeletal deformities. Neurologic is grossly intact.  EKG:   EKG was done today and reviewed by me. There are old nonspecific ST-T wave changes. There is no change from recent EKG in the hospital.   Recent Labs: 01/07/2015: BUN 13; Creatinine 0.79; Hemoglobin 17.5*; Platelets 232; Potassium 3.8; Sodium 138    Lipid Panel    Component Value Date/Time   CHOL 221* 01/08/2015 0146   TRIG 371* 01/08/2015 0146   HDL 28* 01/08/2015 0146   CHOLHDL 7.9 01/08/2015 0146   VLDL 74* 01/08/2015 0146   LDLCALC 119* 01/08/2015 0146      Wt Readings from Last 3 Encounters:  02/22/15 192 lb (87.091 kg)  02/13/15 190 lb (86.183 kg)  01/07/15 184 lb 9.6 oz (83.734 kg)      Current  medicines are reviewed  The patient understands his medications.     ASSESSMENT AND PLAN:

## 2015-02-22 NOTE — Assessment & Plan Note (Signed)
These lesions may be infectious in origin. He asked if I would prescribe antibiotics. It is important that he follow-up with his primary physician to help with the actual diagnosis and treatment of these areas.

## 2015-02-22 NOTE — Assessment & Plan Note (Signed)
He is not having any recurrent chest pain. His stress nuclear study is normal. No further workup. He did have a hypertensive response to stress.

## 2015-02-22 NOTE — Assessment & Plan Note (Signed)
The patient and his wife both describe episodes of increased heart beat. We did not document any significant arrhythmias in the hospital. We will proceed with a 14 day outpatient event recorder.

## 2015-02-26 ENCOUNTER — Encounter: Payer: Self-pay | Admitting: *Deleted

## 2015-02-26 ENCOUNTER — Ambulatory Visit: Payer: Medicaid Other

## 2015-02-26 NOTE — Progress Notes (Signed)
Patient ID: Daryl Fuller, male   DOB: Mar 19, 1966, 49 y.o.   MRN: [id number] [employer] verite 14 day cardiac event monitor applied to patient.

## 2015-04-18 ENCOUNTER — Telehealth: Payer: Self-pay | Admitting: Cardiology

## 2015-04-18 NOTE — Telephone Encounter (Signed)
**Note De-Identified Fuller Obfuscation** Daryl Fuller calling to get monitor results.  He states it has been over a month and wants to know why he hasn't been called. Advised that the heart monitor results were on Daryl Fuller's cart for review and once he has read the report he would be called. He then proceeded to go into a lengthy discussion with" very strong EXPLETIVES about how Daryl Fuller doesn't care about his "heart" and not calling with results of the  monitor after a month and half". States he is recording the call.  Tried to explain to him that once the monitor results comes back that it takes several days to be processed then goes to physicians desk for review. Tried to explain to him that if any abnormal reading was recorded he would have been called.  He didn't want to hear any of this and wanted his appointment cancelled for the end of August and hung up. Have not cancelled his appointment until the results have been called. Will forward to Daryl Fuller and Daryl FifeLynn Via, LPN. Our nurse manager, Daryl Likeeri Suits, Rn was made aware of this conversation.

## 2015-04-18 NOTE — Telephone Encounter (Signed)
New Message        Pt calling wanting for someone to call him so he can know what is going on with his heart. Pt states he has never heard back from our office about lab results and monitor results. Please call back and advise.

## 2015-04-19 ENCOUNTER — Ambulatory Visit: Payer: Medicaid Other | Admitting: Cardiology

## 2015-05-05 ENCOUNTER — Encounter: Payer: Self-pay | Admitting: Cardiology

## 2015-05-05 NOTE — Progress Notes (Signed)
I had seen this patient in the hospital for chest discomfort. There was no proven coronary disease. I then saw him back in the office. He complained of palpitations. Eventually plans were made for him to wear an event recorder. I reviewed the event recorder. There was no marked abnormality. However I was delayed in calling the patient with the result. He called back to our office stating that he was upset because of my delay in getting the results to him.  I have tried to call his home on several occasions. During these phone calls I have not reached him. Therefore I will be sending a letter to give him the information.  The event recorder showed normal sinus rhythm. On one occasion he noted palpitations. PACs were noted at that time. There is one brief episode that suggests atrial fibrillation with a controlled rate. He did not have symptoms at that time. His overall risk score is not significantly elevated. Therefore I would not push for anticoagulation at this time. I feel it would be appropriate for him to have follow-up of his palpitations over time. I will suggest that he be seen in the atrial fibrillation clinic if he is willing to be seen.  I mentioned above that I was delayed in getting results to him. I have worked with our team to be sure that this patient should receive no charges for the event recorder.   Jerral BonitoJeff Kima Malenfant, MD

## 2015-06-17 ENCOUNTER — Ambulatory Visit: Payer: Medicaid Other | Admitting: Cardiology

## 2016-04-05 ENCOUNTER — Encounter (HOSPITAL_COMMUNITY): Payer: Self-pay | Admitting: Emergency Medicine

## 2016-04-05 ENCOUNTER — Emergency Department (HOSPITAL_COMMUNITY): Payer: Medicaid Other

## 2016-04-05 ENCOUNTER — Emergency Department (HOSPITAL_COMMUNITY)
Admission: EM | Admit: 2016-04-05 | Discharge: 2016-04-05 | Disposition: A | Discharge status: Home / Self Care | Payer: Medicaid Other | Attending: Emergency Medicine | Admitting: Emergency Medicine

## 2016-04-05 DIAGNOSIS — R51 Headache: Secondary | ICD-10-CM | POA: Diagnosis present

## 2016-04-05 DIAGNOSIS — Y929 Unspecified place or not applicable: Secondary | ICD-10-CM | POA: Insufficient documentation

## 2016-04-05 DIAGNOSIS — S0083XA Contusion of other part of head, initial encounter: Secondary | ICD-10-CM

## 2016-04-05 DIAGNOSIS — R0781 Pleurodynia: Secondary | ICD-10-CM | POA: Diagnosis not present

## 2016-04-05 DIAGNOSIS — M25511 Pain in right shoulder: Secondary | ICD-10-CM | POA: Diagnosis not present

## 2016-04-05 DIAGNOSIS — Y999 Unspecified external cause status: Secondary | ICD-10-CM | POA: Diagnosis not present

## 2016-04-05 DIAGNOSIS — Y939 Activity, unspecified: Secondary | ICD-10-CM | POA: Insufficient documentation

## 2016-04-05 DIAGNOSIS — I10 Essential (primary) hypertension: Secondary | ICD-10-CM | POA: Insufficient documentation

## 2016-04-05 DIAGNOSIS — F17200 Nicotine dependence, unspecified, uncomplicated: Secondary | ICD-10-CM | POA: Diagnosis not present

## 2016-04-05 HISTORY — DX: Essential (primary) hypertension: I10

## 2016-04-05 MED ORDER — HYDROCODONE-ACETAMINOPHEN 5-325 MG PO TABS: 1.0000 | ORAL_TABLET | ORAL | Status: AC | PRN

## 2016-04-05 NOTE — Discharge Instructions (Signed)
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
